# Patient Record
Sex: Male | Born: 1965 | Race: White | Hispanic: No | Marital: Married | Smoking: Never smoker
Health system: Southern US, Community
[De-identification: ages and names within clinical notes are randomized; demographics above are authoritative.]

## PROBLEM LIST (undated history)

## (undated) DIAGNOSIS — E785 Hyperlipidemia, unspecified: Secondary | ICD-10-CM

## (undated) DIAGNOSIS — J309 Allergic rhinitis, unspecified: Secondary | ICD-10-CM

## (undated) DIAGNOSIS — B009 Herpesviral infection, unspecified: Secondary | ICD-10-CM

## (undated) HISTORY — DX: Allergic rhinitis, unspecified: J30.9

## (undated) HISTORY — PX: GANGLION CYST EXCISION: SHX1691

## (undated) HISTORY — DX: Hyperlipidemia, unspecified: E78.5

## (undated) HISTORY — PX: KNEE ARTHROSCOPY: SUR90

## (undated) HISTORY — DX: Herpesviral infection, unspecified: B00.9

---

## 2004-01-11 ENCOUNTER — Ambulatory Visit: Payer: Self-pay | Admitting: Internal Medicine

## 2004-01-21 ENCOUNTER — Ambulatory Visit: Payer: Self-pay | Admitting: Internal Medicine

## 2004-02-26 ENCOUNTER — Ambulatory Visit: Payer: Self-pay | Admitting: Internal Medicine

## 2004-11-13 ENCOUNTER — Ambulatory Visit: Payer: Self-pay | Admitting: Internal Medicine

## 2004-12-10 ENCOUNTER — Ambulatory Visit: Payer: Self-pay | Admitting: Internal Medicine

## 2005-04-20 ENCOUNTER — Ambulatory Visit: Payer: Self-pay | Admitting: Internal Medicine

## 2006-01-11 ENCOUNTER — Ambulatory Visit: Payer: Self-pay | Admitting: Internal Medicine

## 2006-04-09 ENCOUNTER — Ambulatory Visit: Payer: Self-pay | Admitting: Internal Medicine

## 2006-04-09 LAB — CONVERTED CEMR LAB
Cholesterol: 167 mg/dL (ref 0–200)
HDL: 47.4 mg/dL (ref >39.0)
Triglycerides: 110 mg/dL (ref 0–149)
VLDL: 22 mg/dL (ref 0–40)

## 2007-03-10 ENCOUNTER — Encounter: Payer: Self-pay | Admitting: Internal Medicine

## 2007-03-10 DIAGNOSIS — J309 Allergic rhinitis, unspecified: Secondary | ICD-10-CM | POA: Insufficient documentation

## 2007-03-10 DIAGNOSIS — B009 Herpesviral infection, unspecified: Secondary | ICD-10-CM | POA: Insufficient documentation

## 2007-03-10 DIAGNOSIS — Z9889 Other specified postprocedural states: Secondary | ICD-10-CM

## 2007-03-11 ENCOUNTER — Ambulatory Visit: Payer: Self-pay | Admitting: Internal Medicine

## 2007-03-12 DIAGNOSIS — E785 Hyperlipidemia, unspecified: Secondary | ICD-10-CM

## 2007-04-26 ENCOUNTER — Ambulatory Visit: Payer: Self-pay | Admitting: Internal Medicine

## 2007-04-26 LAB — CONVERTED CEMR LAB
AST: 29 units/L (ref 0–37)
Albumin: 4.2 g/dL (ref 3.5–5.2)
Bilirubin, Direct: 0.2 mg/dL (ref 0.0–0.3)
Chloride: 105 meq/L (ref 96–112)
Cholesterol: 137 mg/dL (ref 0–200)
GFR calc Af Amer: 120 mL/min
GFR calc non Af Amer: 99 mL/min
Glucose, Bld: 111 mg/dL — ABNORMAL HIGH (ref 70–99)
HDL: 43.2 mg/dL (ref >39.0)
LDL Cholesterol: 81 mg/dL (ref 0–99)
Potassium: 3.7 meq/L (ref 3.5–5.1)
Sodium: 141 meq/L (ref 135–145)
Total CHOL/HDL Ratio: 3.2
Triglycerides: 62 mg/dL (ref 0–149)
VLDL: 12 mg/dL (ref 0–40)

## 2007-04-27 ENCOUNTER — Encounter: Payer: Self-pay | Admitting: Internal Medicine

## 2007-10-03 ENCOUNTER — Ambulatory Visit: Payer: Self-pay | Admitting: Internal Medicine

## 2009-03-04 ENCOUNTER — Ambulatory Visit: Payer: Self-pay | Admitting: Internal Medicine

## 2009-03-04 DIAGNOSIS — J069 Acute upper respiratory infection, unspecified: Secondary | ICD-10-CM | POA: Insufficient documentation

## 2009-03-04 LAB — CONVERTED CEMR LAB
ALT: 39 units/L (ref 0–53)
Albumin: 4.4 g/dL (ref 3.5–5.2)
BUN: 9 mg/dL (ref 6–23)
Bilirubin, Direct: 0.2 mg/dL (ref 0.0–0.3)
Chloride: 99 meq/L (ref 96–112)
Cholesterol: 198 mg/dL (ref 0–200)
GFR calc non Af Amer: 111.78 mL/min (ref >60)
HDL: 49 mg/dL (ref >39.00)
LDL Cholesterol: 110 mg/dL — ABNORMAL HIGH (ref 0–99)
Potassium: 4.1 meq/L (ref 3.5–5.1)
Total Protein: 7.4 g/dL (ref 6.0–8.3)
Triglycerides: 193 mg/dL — ABNORMAL HIGH (ref 0.0–149.0)
VLDL: 38.6 mg/dL (ref 0.0–40.0)

## 2009-03-05 ENCOUNTER — Encounter: Payer: Self-pay | Admitting: Internal Medicine

## 2009-03-22 ENCOUNTER — Encounter: Payer: Self-pay | Admitting: Internal Medicine

## 2009-03-26 ENCOUNTER — Encounter: Payer: Self-pay | Admitting: Internal Medicine

## 2009-06-21 ENCOUNTER — Ambulatory Visit: Payer: Self-pay | Admitting: Internal Medicine

## 2009-07-04 ENCOUNTER — Ambulatory Visit: Payer: Self-pay | Admitting: Internal Medicine

## 2010-03-11 NOTE — Miscellaneous (Signed)
Summary: Flu Vaccine Given-[facility]   Clinical Lists Changes  Observations: Added new observation of FLU VAX: Historical (03/22/2009 14:56)      Immunization History:  Influenza Immunization History:    Influenza:  historical (03/22/2009)

## 2010-03-11 NOTE — Miscellaneous (Signed)
Summary: FL[facility]IRIN/[facility]  [facility]   Imported By: [place] 03/30/2009 09:04:47  _____________________________________________________________________  External Attachment:    Type:   Image     Comment:   External Document

## 2010-03-11 NOTE — Assessment & Plan Note (Signed)
Summary: sore throat/ear ache/cd   Vital Signs:  Patient profile:   45 year old male Height:      73 inches Weight:      214 pounds BMI:     28.34 O2 Sat:      97 % on Room air Temp:     97.4 degrees F oral Pulse rate:   66 / minute BP sitting:   114 / 78  (left arm) Cuff size:   regular  Vitals Entered By: Bill Salinas CMA (Jul 04, 2009 9:07 AM)  O2 Flow:  Room air CC: pt here with c/o sore throat with drainage x 3 days/ ab   Primary Care Provider:  Ancil Dewan  CC:  pt here with c/o sore throat with drainage x 3 days/ ab.  History of Present Illness: Patient presents with a 3 days h/o sore throat, along with myalgias and odynophagia.Marland Kitchen HIs children have been sick. His wife has had a sore throat and saw Dr. Felicity Coyer earlier in the week. He denies rigors, SOB, cough, N/V/D. He has taken no OTC meds.  He is still in a Doctor, hospital course. He does report that he is sleeping better on medication.   Current Medications (verified): 1)  Allegra-D 12 Hour 60-120 Mg  Tb12 (Fexofenadine-Pseudoephedrine) .... As Needed 2)  Rhinocort Aqua 32 Mcg/act  Susp (Budesonide) .... As Needed 3)  Simvastatin 80 Mg Tabs (Simvastatin) .Marland Kitchen.. 1 By Mouth Qpm 4)  Valacyclovir Hcl 1 Gm Tabs (Valacyclovir Hcl) .Marland Kitchen.. 1 By Mouth Once Daily For Hsv Recurrence 5)  Alprazolam 0.5 Mg Tabs (Alprazolam) .... 1/2 or 1 Q 6 As Needed; 1 At Bedtime As Needed Sleep.  Allergies (verified): No Known Drug Allergies PMH-FH-SH reviewed-no changes except otherwise noted  Review of Systems  The patient denies anorexia, fever, weight loss, hoarseness, chest pain, dyspnea on exertion, abdominal pain, and muscle weakness.    Physical Exam  General:  WNWD white male in no distress Head:  Normocephalic and atraumatic without obvious abnormalities. No apparent alopecia or balding. Eyes:  corneas and lenses clear and no injection.   Mouth:  Posterior pharynx with erythema. NO exudate. Tonsils appear normal.  Neck:   supple, no masses, and no thyromegaly.   Lungs:  normal respiratory effort and normal breath sounds.   Heart:  normal rate and regular rhythm.   Abdomen:  soft, non-tender, and normal bowel sounds.   Pulses:  2+ radial Neurologic:  alert & oriented X3, cranial nerves II-XII intact, and strength normal in all extremities.   Skin:  turgor normal and color normal.   Cervical Nodes:  no anterior cervical adenopathy and no posterior cervical adenopathy.   Psych:  Oriented X3, good eye contact, and not anxious appearing.     Impression & Recommendations:  Problem # 1:  PHARYNGITIS-ACUTE (ICD-462)  Exposure and symptoms. Exam is suspicious  Plan - Amoxicillin 875mg  two times a day x 7           gargle of choice.  His updated medication list for this problem includes:    Amoxicillin 875 Mg Tabs (Amoxicillin) .Marland Kitchen... 1 by mouth two times a day x 7  Complete Medication List: 1)  Allegra-d 12 Hour 60-120 Mg Tb12 (Fexofenadine-pseudoephedrine) .... As needed 2)  Rhinocort Aqua 32 Mcg/act Susp (Budesonide) .... As needed 3)  Simvastatin 80 Mg Tabs (Simvastatin) .Marland Kitchen.. 1 by mouth qpm 4)  Valacyclovir Hcl 1 Gm Tabs (Valacyclovir hcl) .Marland Kitchen.. 1 by mouth once daily for hsv recurrence 5)  Alprazolam 0.5 Mg Tabs (Alprazolam) .... 1/2 or 1 q 6 as needed; 1 at bedtime as needed sleep. 6)  Amoxicillin 875 Mg Tabs (Amoxicillin) .Marland Kitchen.. 1 by mouth two times a day x 7 Prescriptions: AMOXICILLIN 875 MG TABS (AMOXICILLIN) 1 by mouth two times a day x 7  #14 x 0   Entered and Authorized by:   Jacques Navy MD   Signed by:   Jacques Navy MD on 07/04/2009   Method used:   Handwritten   RxID:   [id number]

## 2010-03-11 NOTE — Assessment & Plan Note (Signed)
Summary: rx consult-lb   Vital Signs:  Patient profile:   45 year old male Height:      73 inches Weight:      215 pounds BMI:     28.47 O2 Sat:      97 % on Room air Temp:     98.1 degrees F oral Pulse rate:   64 / minute BP sitting:   118 / 76  (left arm) Cuff size:   regular  Vitals Entered By: Bill Salinas CMA (Jun 21, 2009 9:06 AM)  O2 Flow:  Room air CC: pt here to discuss medications/ ab   Primary Care Provider:  Aleeyah Bensen  CC:  pt here to discuss medications/ ab.  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 He denies any depression, lack of focus or difficutly staying on task   He is also in need of refill Rxs  Current Medications (verified): 1)  Allegra-D 12 Hour 60-120 Mg  Tb12 (Fexofenadine-Pseudoephedrine) .... As Needed 2)  Rhinocort Aqua 32 Mcg/act  Susp (Budesonide) .... As Needed 3)  Simvastatin 80 Mg Tabs (Simvastatin) .Marland Kitchen.. 1 By Mouth Qpm 4)  Valtrex 500 Mg Tabs (Valacyclovir Hcl) .... 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 physically active with tennis, mountain biking  Risk Factors: Smoking Status: never (03/10/2007)  Review of Systems  The patient denies fever, weight loss, weight gain, chest pain, dyspnea on exertion, headaches, abdominal pain, severe  indigestion/heartburn, suspicious skin lesions, and depression.    Physical Exam  General:  Well-developed,well-nourished,in no acute distress; alert,appropriate and cooperative throughout examination Head:  normocephalic and atraumatic.   Eyes:  pupils equal and pupils round.   Neck:  supple.   Lungs:  normal respiratory effort and normal breath sounds.   Heart:  normal rate and regular rhythm.   Neurologic:  alert & oriented X3, cranial nerves II-XII intact, and gait normal.   Skin:  turgor normal and color normal.   Psych:  Oriented X3, memory intact for recent and remote, normally interactive, and good eye contact.     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  Plan - renewed Rx for valtrex (generic)  Problem # 2:  ANXIETY STATE, UNSPECIFIED (ICD-300.00) Peformance anxiety related to stress associated with his training program.  Plan - alprazolam 0.5 mg 1/2 or 1 by mouth q 6 as needed.  His updated medication list for this problem includes:    Alprazolam 0.5 Mg Tabs (Alprazolam) .Marland Kitchen... 1/2 or 1 q 6 as needed; 1 at bedtime as needed sleep.  Complete Medication List: 1)  Allegra-d 12 Hour 60-120 Mg Tb12 (Fexofenadine-pseudoephedrine) .... As needed 2)  Rhinocort Aqua 32 Mcg/act Susp (Budesonide) .... As needed 3)  Simvastatin 80 Mg Tabs (Simvastatin) .Marland Kitchen.. 1 by mouth qpm  4)  Valacyclovir Hcl 1 Gm Tabs (Valacyclovir hcl) .Marland Kitchen.. 1 by mouth once daily for hsv recurrence 5)  Alprazolam 0.5 Mg Tabs (Alprazolam) .... 1/2 or 1 q 6 as needed; 1 at bedtime as needed sleep. Prescriptions: ALPRAZOLAM 0.5 MG TABS (ALPRAZOLAM) 1/2 or 1 q 6 as needed; 1 at bedtime as needed sleep.  #100 x 1   Entered and Authorized by:   Jacques Navy MD   Signed by:   Jacques Navy MD on 06/23/2009   Method used:   Handwritten   RxID:    [id number] VALACYCLOVIR HCL 1 GM TABS (VALACYCLOVIR HCL) 1 by mouth once daily for HSV recurrence  #10 x 5   Entered and Authorized by:   Jacques Navy MD   Signed by:   Jacques Navy MD on 06/21/2009   Method used:   [facility] [address] [phone]* (retail)       [address]       [place], [place]  [address]       Ph: [phone] or [phone]       Fax: [phone]   RxID:   [id number]

## 2010-03-11 NOTE — Assessment & Plan Note (Signed)
Summary: cold,sinus/cd   Vital Signs:  Patient profile:   45 year old male Height:      73 inches Weight:      226 pounds BMI:     29.92 O2 Sat:      97 % on Room air Temp:     98.0 degrees F oral Pulse rate:   90 / minute BP sitting:   120 / 84  (left arm) Cuff size:   regular  Vitals Entered By: Bill Salinas CMA (March 04, 2009 9:33 AM)  O2 Flow:  Room air CC: pt here with complaint of sneezing with green mucous and coughing. Pt also has sinus congestion symptoms present x 3 days with no fever. Pt needs refill on simvastatin/ ab   Primary Care Provider:  Norins  CC:  pt here with complaint of sneezing with green mucous and coughing. Pt also has sinus congestion symptoms present x 3 days with no fever. Pt needs refill on simvastatin/ ab.  History of Present Illness: Presents for routine medical follow up for sneezing, drainage and some green drainage. He has otherwise been doing well.  He is due for lipid follow-up with lab and renewal of Rx.   Current Medications (verified): 1)  Allegra-D 12 Hour 60-120 Mg  Tb12 (Fexofenadine-Pseudoephedrine) .... As Needed 2)  Rhinocort Aqua 32 Mcg/act  Susp (Budesonide) .... As Needed 3)  Simvastatin 40 Mg  Tabs (Simvastatin) .Marland Kitchen.. 1 By Mouth Two Times A Day 4)  Valtrex 500 Mg Tabs (Valacyclovir Hcl) .... 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 physically active with tennis,  mountain biking  Review of Systems  The patient denies anorexia, fever, decreased hearing, chest pain, dyspnea on exertion, peripheral edema, abdominal pain, hematochezia, incontinence, muscle weakness, difficulty walking, unusual weight change, enlarged lymph nodes, and angioedema.    Physical Exam  General:  alert, well-developed, well-nourished, and well-hydrated.   Head:  normocephalic and atraumatic.  nosinus tenderness to exam  Ears:  R ear normal and L ear normal.   Mouth:  Throat clear Neck:  full ROM and no thyromegaly.   Lungs:  normal respiratory effort, normal breath sounds, no crackles, and no wheezes.   Heart:  normal rate and regular rhythm.   Msk:  normal ROM and no joint tenderness.   Neurologic:  alert & oriented X3, cranial nerves II-XII intact, and gait normal.     Impression & Recommendations:  Problem # 1:  URI (ICD-465.9) Exposure to youngsters with illness  Plan - z-pak  His updated medication list for this problem includes:    Allegra-d 12 Hour 60-120 Mg Tb12 (Fexofenadine-pseudoephedrine) .Marland Kitchen... As needed  Problem # 2:  HYPERLIPIDEMIA (ICD-272.4)  due for follow-up lab.  His updated medication list for this problem includes:    Simvastatin 80 Mg Tabs (Simvastatin) .Marland Kitchen... 1 by mouth qpm  Orders: TLB-Lipid Panel (80061-LIPID) TLB-Hepatic/Liver Function Pnl (80076-HEPATIC)  Complete Medication List: 1)  Allegra-d 12 Hour 60-120 Mg Tb12 (Fexofenadine-pseudoephedrine) .... As needed 2)  Rhinocort Aqua  32 Mcg/act Susp (Budesonide) .... As needed 3)  Simvastatin 80 Mg Tabs (Simvastatin) .Marland Kitchen.. 1 by mouth qpm 4)  Valtrex 500 Mg Tabs (Valacyclovir hcl) .... Take 1 tablet by mouth two times a day as needed 5)  Azithromycin 500 Mg Tabs (Azithromycin) .Marland Kitchen.. 1 by mouth once daily x 3  Other Orders: TLB-BMP (Basic Metabolic Panel-BMET) (80048-METABOL) Prescriptions: SIMVASTATIN 80 MG TABS (SIMVASTATIN) 1 by mouth qPM  #30 x 12   Entered and Authorized by:    Jacques Navy MD   Signed by:   Jacques Navy MD on 03/04/2009   Method used:   [facility]        [facility] [address] [phone]* (retail)       [address]       [place], [place]  [address]       Ph: [phone] or [phone]       Fax: [phone]   RxID:   [id number] AZITHROMYCIN 500 MG TABS (AZITHROMYCIN) 1 by mouth once daily x 3  #3 x 0   Entered and Authorized by:   Jacques Navy MD   Signed by:   Jacques Navy MD on 03/04/2009   Method used:   [facility]ron[facility] to        [facility] [address] [phone]* (retail)       [address]       [place], [place]  [address]       Ph: [phone] or [phone]       Fax: [phone]   RxID:   [id number]

## 2010-03-11 NOTE — Letter (Signed)
   [facility] [address] [place], [place]  [address] Phone: [phone]      March 05, 2009   NATANAEL SALADIN [address] [place], [place] [address]  RE:  LAB RESULTS  Dear  Mr. NOLASCO,  The following is an interpretation of your most recent lab tests.  Please take note of any instructions provided or changes to medications that have resulted from your lab work.  ELECTROLYTES:  Good - no changes needed  KIDNEY FUNCTION TESTS:  Good - no changes needed  LIVER FUNCTION TESTS:  Good - no changes needed  Health professionals look at cholesterol as more involved than just the total cholesterol. We consider the level of LDL (bad) cholesterol, HDL (good), cholesterol, and Triglycerides (Grease) in the blood.  1. Your LDL should be under 100, and the HDL should be over 45, if you have any vascular disease such as heart attack, angina, stroke, TIA (mini stroke), claudication (pain in the legs when you walk due to poor circulation),  Abdominal Aortic Aneurysm (AAA), diabetes or prediabetes.  2. Your LDL should be under 130 if you have any two of the following:     a. Smoke or chew tobacco,     b. High blood pressure (if you are on medication or over 140/90 without medication),     c. Male gender,    d. HDL below 40,    e. A male relative (father, brother, or son), who have had any vascular event          as described in #1. above under the age of 57, or a male relative (mother,       sister, or daughter) who had an event as described above under age 1. (An HDL over 60 will subtract one risk factor from the total, so if you have two items in # 2 above, but an HDL over 60, you then fall into category # 3 below).  3. Your LDL should be under 160 if you have any one of the above.  Triglycerides should be under 200 with the ideal being under 150.  For diabetes or pre-diabetes, the ideal HgbA1C should be under 6.0%.  If you fall into any of the above categories, you  should make a follow up appointment to discuss this with your physician.  LIPID PANEL:  Good - no changes needed Triglyceride: 193.0   Cholesterol: 198   LDL: 110   HDL: 49.00   Chol/HDL%:  4   Adequate cholesterol control on present medications.  Call or e-mail me if you have questions ([email or web address]).   Sincerely Yours,    Jacques Navy MD

## 2010-04-29 ENCOUNTER — Other Ambulatory Visit: Payer: Self-pay | Admitting: *Deleted

## 2010-05-07 ENCOUNTER — Telehealth: Payer: Self-pay | Admitting: *Deleted

## 2010-05-07 MED ORDER — SIMVASTATIN 80 MG PO TABS: 80.0000 mg | ORAL_TABLET | Freq: Every day | ORAL | Status: DC

## 2010-05-07 NOTE — Telephone Encounter (Signed)
refills  

## 2010-09-12 ENCOUNTER — Encounter: Payer: Self-pay | Admitting: Internal Medicine

## 2010-09-12 ENCOUNTER — Ambulatory Visit (INDEPENDENT_AMBULATORY_CARE_PROVIDER_SITE_OTHER): Payer: 59 | Admitting: Internal Medicine

## 2010-09-12 DIAGNOSIS — B009 Herpesviral infection, unspecified: Secondary | ICD-10-CM

## 2010-09-12 DIAGNOSIS — E785 Hyperlipidemia, unspecified: Secondary | ICD-10-CM

## 2010-09-12 DIAGNOSIS — Z0000 Encounter for general adult medical examination without abnormal findings: Secondary | ICD-10-CM

## 2010-09-12 MED ORDER — VALACYCLOVIR HCL 1 G PO TABS: 1000.0000 mg | ORAL_TABLET | Freq: Every day | ORAL | Status: DC

## 2010-09-12 MED ORDER — SIMVASTATIN 80 MG PO TABS: 80.0000 mg | ORAL_TABLET | Freq: Every day | ORAL | Status: DC

## 2010-09-12 MED ORDER — ALPRAZOLAM 0.5 MG PO TABS: 0.5000 mg | ORAL_TABLET | Freq: Every evening | ORAL | Status: DC | PRN

## 2010-09-12 MED ORDER — BUDESONIDE 32 MCG/ACT NA SUSP: 1.0000 | Freq: Every day | NASAL | Status: AC

## 2010-09-12 NOTE — Progress Notes (Signed)
Subjective:    Patient ID: Jack Farley, male    DOB: 05-Feb-1966, 45 y.o.   MRN: [id number]  HPI Jack Farley presents to follow-up. In the interval since last visit he has had knife less vasectomy. He is commuting to [place] 3 days a week - 12 hr days. He is trying to get in some exercise. He feels he is in good health and doing well.   Past Medical History  Diagnosis Date  . Herpes simplex without mention of complication   . Allergic rhinitis, cause unspecified    Past Surgical History  Procedure Date  . Knee arthroscopy    Family History  Problem Relation Age of Onset  . Mental illness Mother   . Hyperlipidemia Father   . Cancer Neg Hx   . Diabetes Neg Hx   . Heart disease Neg Hx    History   Social History  . Marital Status: Married    Spouse Name: N/A    Number of Children: 2  . Years of Education: 18   Occupational History  . Firefighter and investments    Social History Main Topics  . Smoking status: Never Smoker   . Smokeless tobacco: Never Used  . Alcohol Use: Yes  . Drug Use: No  . Sexually Active: Yes -- Male partner(s)   Other Topics Concern  . Not on file   Social History Narrative   [place], MBA [employer], Married '04. 2 sons- '06, '09. 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 Spouse- Psychologist, educational and full-time mother. Physically active when time allows  with mountain biking and tennis. Life is good.       Review of Systems Review of Systems  Constitutional:  Negative for fever, chills, activity change and unexpected weight change.  HEENT:  Negative for hearing loss, ear pain, congestion, neck stiffness and postnasal drip. Negative for sore throat or swallowing problems. Negative for dental complaints.   Eyes: Negative for vision loss or change in visual acuity.  Respiratory: Negative for chest tightness and wheezing.   Cardiovascular: Negative for chest pain and palpitation. No decreased exercise  tolerance Gastrointestinal: No change in bowel habit. No bloating or gas. No reflux or indigestion Genitourinary: Negative for urgency, frequency, flank pain and difficulty urinating.  Musculoskeletal: Negative for myalgias, back pain, arthralgias and gait problem.  Neurological: Negative for dizziness, tremors, weakness and headaches.  Hematological: Negative for adenopathy.  Psychiatric/Behavioral: Negative for behavioral problems and dysphoric mood.       Objective:   Physical Exam Vital signs reviewed Gen'l: Well nourished well developed white male in no acute distress  HENT:  Head: Normocephalic and atraumatic.  Right Ear: External ear normal. EAC/TM nl Left Ear: External ear normal.  EAC/TM nl Nose: Nose normal.  Mouth/Throat: Oropharynx is clear and moist. Dentition - native, in good repair. No buccal or palatal lesions. Posterior pharynx clear. Eyes: Conjunctivae and sclera clear. EOM intact. Pupils are equal, round, and reactive to light. Right eye exhibits no discharge. Left eye exhibits no discharge. Neck: Normal range of motion. Neck supple. No JVD present. No tracheal deviation present. No thyromegaly present.  Cardiovascular: Normal rate, regular rhythm, no gallop, no friction rub, no murmur heard.      Quiet precordium. 2+ radial and DP pulses . No carotid bruits Pulmonary/Chest: Effort normal. No respiratory distress or increased WOB, no wheezes, no rales. No chest wall deformity or CVAT. Abdominal: Soft. Bowel sounds are normal in all quadrants. He  exhibits no distension, no tenderness, no rebound or guarding, No heptosplenomegaly  Genitourinary:  deferred Musculoskeletal: Normal range of motion. He exhibits no edema and no tenderness.       Small and large joints without redness, synovial thickening or deformity. Full range of motion preserved about all small, median and large joints.  Lymphadenopathy:    He has no cervical or supraclavicular adenopathy.    Neurological: He is alert and oriented to person, place, and time. CN II-XII intact. DTRs 2+ and symmetrical biceps, radial and patellar tendons. Cerebellar function normal with no tremor, rigidity, normal gait and station.  Skin: Skin is warm and dry. No rash noted. No erythema.  Psychiatric: He has a normal mood and affect. His behavior is normal. Thought content normal.   Lab Results  Component Value Date   CHOL 198 03/04/2009   TRIG 193.0* 03/04/2009   HDL 49.00 03/04/2009   ALT 39 03/04/2009   AST 28 03/04/2009   NA 138 03/04/2009   K 4.1 03/04/2009   CL 99 03/04/2009   CREATININE 0.8 03/04/2009   BUN 9 03/04/2009   CO2 31 03/04/2009   Lab Results  Component Value Date   LDLCALC 110* 03/04/2009           Assessment & Plan:

## 2010-09-14 ENCOUNTER — Encounter: Payer: Self-pay | Admitting: Internal Medicine

## 2010-09-14 DIAGNOSIS — Z0000 Encounter for general adult medical examination without abnormal findings: Secondary | ICD-10-CM | POA: Insufficient documentation

## 2010-09-14 NOTE — Assessment & Plan Note (Signed)
No labs since Jan '11. He reports that he has been off medication for several weeks due to Rx running out. He was on Zocor 80 mg and has been on this dose for more than 1 year without side effects or complications. Per FDA advisory he may remain on the same medication.  Plan - return Sept 3 for labs with recommendations to follow

## 2010-09-14 NOTE — Assessment & Plan Note (Signed)
Interval history notable for vasectomy. Physical exam is normal. Labs are pending - Sept 3rd. Due for tetanus booster.  In summary- a very nice man who is busy with work and a young family. He is encouraged to find the time for exercise as a worthwhile investment in his health. He should return for a general exam in 2 years.

## 2010-09-14 NOTE — Assessment & Plan Note (Signed)
Stable. He has 3 outbreaks a year max and does not need daily suppression.  Plan - refilled Rx.

## 2010-09-15 ENCOUNTER — Telehealth: Payer: Self-pay | Admitting: *Deleted

## 2010-09-15 NOTE — Telephone Encounter (Signed)
error 

## 2010-10-16 ENCOUNTER — Other Ambulatory Visit (INDEPENDENT_AMBULATORY_CARE_PROVIDER_SITE_OTHER): Payer: 59

## 2010-10-16 DIAGNOSIS — E785 Hyperlipidemia, unspecified: Secondary | ICD-10-CM

## 2010-10-16 DIAGNOSIS — Z0000 Encounter for general adult medical examination without abnormal findings: Secondary | ICD-10-CM

## 2010-10-16 LAB — HEPATIC FUNCTION PANEL
ALT: 39 U/L (ref 0–53)
AST: 32 U/L (ref 0–37)
Total Bilirubin: 2 mg/dL — ABNORMAL HIGH (ref 0.3–1.2)
Total Protein: 7.6 g/dL (ref 6.0–8.3)

## 2010-10-16 LAB — COMPREHENSIVE METABOLIC PANEL
AST: 32 U/L (ref 0–37)
Albumin: 4.6 g/dL (ref 3.5–5.2)
Alkaline Phosphatase: 40 U/L (ref 39–117)
Glucose, Bld: 104 mg/dL — ABNORMAL HIGH (ref 70–99)
Potassium: 3.6 mEq/L (ref 3.5–5.1)
Sodium: 141 mEq/L (ref 135–145)
Total Bilirubin: 2 mg/dL — ABNORMAL HIGH (ref 0.3–1.2)
Total Protein: 7.6 g/dL (ref 6.0–8.3)

## 2010-10-16 LAB — LIPID PANEL
Cholesterol: 146 mg/dL (ref 0–200)
HDL: 56.4 mg/dL (ref >39.00)
Triglycerides: 87 mg/dL (ref 0.0–149.0)
VLDL: 17.4 mg/dL (ref 0.0–40.0)

## 2010-10-20 ENCOUNTER — Encounter: Payer: Self-pay | Admitting: Internal Medicine

## 2011-07-24 ENCOUNTER — Other Ambulatory Visit: Payer: Self-pay | Admitting: *Deleted

## 2011-07-24 MED ORDER — SIMVASTATIN 80 MG PO TABS: 80.0000 mg | ORAL_TABLET | Freq: Every day | ORAL | Status: DC

## 2011-07-24 NOTE — Telephone Encounter (Signed)
REFILL SENT TO [facility] FOR ZOCAR AND MESSAGE TO MAKE APPT.FOR YEARLY EXAM

## 2011-08-04 ENCOUNTER — Other Ambulatory Visit: Payer: Self-pay

## 2011-08-04 MED ORDER — ALPRAZOLAM 0.5 MG PO TABS: 0.5000 mg | ORAL_TABLET | Freq: Every evening | ORAL | Status: DC | PRN

## 2011-08-04 NOTE — Telephone Encounter (Signed)
Refill on medication alprazolam called to [facility] pharmacy. Patient notified

## 2011-08-04 NOTE — Telephone Encounter (Signed)
The flaw with e-mail is reliable transfer of information. My fault. Better for refill requests to come through regular channels.   OK for refill for alprazolam x 5

## 2011-08-04 NOTE — Telephone Encounter (Signed)
Pt called stating he has been emailing with MD back and forth regarding refill of alprazolam but so far Rx is not at [facility]. Pt is requesting status of this refill, please advise.

## 2012-08-08 ENCOUNTER — Ambulatory Visit (INDEPENDENT_AMBULATORY_CARE_PROVIDER_SITE_OTHER): Payer: BC Managed Care – PPO | Admitting: Internal Medicine

## 2012-08-08 ENCOUNTER — Encounter: Payer: Self-pay | Admitting: Internal Medicine

## 2012-08-08 VITALS — BP 120/82 | HR 79 | Temp 97.8°F | Ht 73.0 in | Wt 226.8 lb

## 2012-08-08 DIAGNOSIS — E785 Hyperlipidemia, unspecified: Secondary | ICD-10-CM

## 2012-08-08 DIAGNOSIS — B009 Herpesviral infection, unspecified: Secondary | ICD-10-CM

## 2012-08-08 DIAGNOSIS — Z23 Encounter for immunization: Secondary | ICD-10-CM

## 2012-08-08 DIAGNOSIS — Z0000 Encounter for general adult medical examination without abnormal findings: Secondary | ICD-10-CM

## 2012-08-08 MED ORDER — SIMVASTATIN 80 MG PO TABS: 80.0000 mg | ORAL_TABLET | Freq: Every day | ORAL | Status: DC

## 2012-08-08 MED ORDER — VALACYCLOVIR HCL 1 G PO TABS: 1000.0000 mg | ORAL_TABLET | Freq: Every day | ORAL | Status: DC

## 2012-08-08 MED ORDER — ALPRAZOLAM 0.5 MG PO TABS: 0.5000 mg | ORAL_TABLET | Freq: Every evening | ORAL | Status: AC | PRN

## 2012-08-08 MED ORDER — SIMVASTATIN 80 MG PO TABS: 80.0000 mg | ORAL_TABLET | Freq: Every day | ORAL | Status: AC

## 2012-08-08 MED ORDER — VALACYCLOVIR HCL 1 G PO TABS: 1000.0000 mg | ORAL_TABLET | Freq: Every day | ORAL | Status: AC

## 2012-08-08 NOTE — Progress Notes (Signed)
[facility] would not [facility] electronically.

## 2012-08-08 NOTE — Patient Instructions (Addendum)
Thanks for coming in.  For cholesterol - restart simvastatin and have lab work 4 weeks later. Lab orders are on fill: no appointment is necessary and results will be available on MyChart.  Exercise and fitness are THE most important health investment you can make. Minimal requirement is 3 sessions a week where you sustain a heart rate about 180% of resting for 30 minutes (need warm up and cool down). You can do any exercise that will get you to goal. You also need to work on Flex and stretch.  For entertainment and education read: Younger Next Year - for men. If you do not find this fun and helpful I will pay you back the purchase price.  Immunizations today - Tdap, good for ten years.  Screening up to date for now. At 50 we look to colorectal cancer screening. Prostate screening is controversial: [place] preventive healthcare taskforece says to not screen. The [employer] of Urology say to screen from 50 - 59. It is all about comfort with risk, the frequency of the disease and how slowly it moves. Can discuss more at 50.   For rectal discomfort - Nupercainal ointment can be very helpful - OTC and comes in a generic as well.  Fecal staining - common and not an issue unless it gets progressively worse.  As an aside - there is a theory about the sanctity of the parental bed!!

## 2012-08-08 NOTE — Progress Notes (Signed)
Subjective:    Patient ID: Jack Farley, male    DOB: [birth year], 47 y.o.   MRN: [id number]  HPI Mr. Slinker presents for routine medical follow up. He is doing well: has been healthy with no major illness, no surgery no injury. He is working out some. Current with dentist and eye doctor - wears contacts.   Past Medical History  Diagnosis Date  . Herpes simplex without mention of complication   . Allergic rhinitis, cause unspecified    Past Surgical History  Procedure Laterality Date  . Knee arthroscopy     Family History  Problem Relation Age of Onset  . Mental illness Mother   . Hyperlipidemia Father   . Cancer Neg Hx   . Diabetes Neg Hx   . Heart disease Neg Hx    History   Social History  . Marital Status: Married    Spouse Name: N/A    Number of Children: 2  . Years of Education: 18   Occupational History  . Firefighter and investments    Social History Main Topics  . Smoking status: Never Smoker   . Smokeless tobacco: Never Used  . Alcohol Use: Yes  . Drug Use: No  . Sexually Active: Yes -- Male partner(s)   Other Topics Concern  . Not on file   Social History Narrative   [place], MBA [employer], Married '04. 2 sons- '06, '09. 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 Spouse- Psychologist, educational and full-time mother. Physically active when time allows  with mountain biking and tennis. Life is good.    Current Outpatient Prescriptions on File Prior to Visit  Medication Sig Dispense Refill  . ALPRAZolam (XANAX) 0.5 MG tablet Take 1 tablet (0.5 mg total) by mouth at bedtime as needed. 1/2 or 1 tablet q 6 hours prn , 1 at bedtime prn  60 tablet  5  . budesonide (RHINOCORT AQUA) 32 MCG/ACT nasal spray Place 1 spray into the nose daily.  1 Bottle  2  . fexofenadine-pseudoephedrine (ALLEGRA-D) 60-120 MG per tablet Take 1 tablet by mouth 2 (two) times daily.        . valACYclovir (VALTREX) 1000 MG tablet Take 1 tablet (1,000 mg  total) by mouth daily.  15 tablet  2  . simvastatin (ZOCOR) 80 MG tablet Take 1 tablet (80 mg total) by mouth at bedtime. PATIENT NEEDS TO MAKE YEARLY OFFICE VISIT WITH DR. Debby Bud. LOV 09/2010  90 tablet  3   No current facility-administered medications on file prior to visit.          Review of Systems Constitutional:  Negative for fever, chills, activity change and unexpected weight change.  HEENT:  Negative for hearing loss, ear pain, congestion, neck stiffness and postnasal drip. Negative for sore throat or swallowing problems. Negative for dental complaints.   Eyes: Negative for vision loss or change in visual acuity.  Respiratory: Negative for chest tightness and wheezing. Negative for DOE.   Cardiovascular: Negative for chest pain or palpitations. No decreased exercise tolerance Gastrointestinal: No change in bowel habit. No bloating or gas. No reflux or indigestion Genitourinary: Negative for urgency, frequency, flank pain and difficulty urinating.  Musculoskeletal: Negative for myalgias, back pain, arthralgias and gait problem.  Neurological: Negative for dizziness, tremors, weakness and headaches.  Hematological: Negative for adenopathy.  Psychiatric/Behavioral: Negative for behavioral problems and dysphoric mood.     Objective:   Physical Exam Filed Vitals:   08/08/12 1336  BP:  120/82  Pulse: 79  Temp: 97.8 F (36.6 C)   Wt Readings from Last 3 Encounters:  08/08/12 226 lb 12.8 oz (102.876 kg)  09/12/10 223 lb (101.152 kg)  07/04/09 214 lb (97.07 kg)   Gen'l: Well nourished well developed, overweight white male in no acute distress  HEENT: Head: Normocephalic and atraumatic. Right Ear: External ear normal. EAC/TM nl. Left Ear: External ear normal.  EAC/TM nl. Nose: Nose normal. Mouth/Throat: Oropharynx is clear and moist. Dentition - native, in good repair. No buccal or palatal lesions. Posterior pharynx clear. Eyes: Conjunctivae and sclera clear. EOM intact. Pupils  are equal, round, and reactive to light. Right eye exhibits no discharge. Left eye exhibits no discharge. Neck: Normal range of motion. Neck supple. No JVD present. No tracheal deviation present. No thyromegaly present.  Cardiovascular: Normal rate, regular rhythm, no gallop, no friction rub, no murmur heard.      Quiet precordium. 2+ radial and DP pulses . No carotid bruits Pulmonary/Chest: Effort normal. No respiratory distress or increased WOB, no wheezes, no rales. No chest wall deformity or CVAT. Abdomen: Soft. Bowel sounds are normal in all quadrants. He exhibits no distension, no tenderness, no rebound or guarding, No heptosplenomegaly  Genitourinary:  deferred Musculoskeletal: Normal range of motion. He exhibits no edema and no tenderness.       Small and large joints without redness, synovial thickening or deformity. Full range of motion preserved about all small, median and large joints.  Lymphadenopathy:    He has no cervical or supraclavicular adenopathy.  Neurological: He is alert and oriented to person, place, and time. CN II-XII intact. DTRs 2+ and symmetrical biceps, radial and patellar tendons. Cerebellar function normal with no tremor, rigidity, normal gait and station.  Skin: Skin is warm and dry. No rash noted. No erythema.  Psychiatric: He has a normal mood and affect. His behavior is normal. Thought content normal.   Labs pending - restart of simvastatin       Assessment & Plan:

## 2012-08-11 NOTE — Assessment & Plan Note (Signed)
Continues to have periodic outbreaks but has a good response to valtrex.  Refill Rx provided.

## 2012-08-11 NOTE — Assessment & Plan Note (Signed)
He has been off medication for several weeks due to mix up on refill authorization.  Plan Resume medication - high dose simvastatin that was well tolerated  Follow up lab

## 2012-08-11 NOTE — Assessment & Plan Note (Signed)
Interval history is unremarkable w/o major illness, injury or surgery. Physical exam is normal except for weight. Lab is pending - he will restart simvastatin and come in for 4 lab 4 weeks after. Given Tdap today.  In summary - a healthy man followed for hyperlipidemia. He is encouraged to develop a regular exercise program and to work on weight management: Diet management: smart food choices, PORTION SIZE CONTROL, regular exercise. Goal - to loose 1-2 lbs.month. Target weight - 200 to 210. He will be advised as to medication dosing for cholesterol management based on lab results. Next full physical in 2-3 years but he will need annual lipid labs for refills of medication.

## 2012-09-15 ENCOUNTER — Telehealth: Payer: Self-pay

## 2012-09-15 NOTE — Telephone Encounter (Signed)
Patient called back and stated he is unable to get his Simvastatin 80 mg from [facility]. I advised patient I will check into it.  [facility] call to [facility] representative. Since this is patient's first time filling with them she wanted to know was this dose of Simvastatin something patient has already been on or was it new. I let her know this is something he's been on. I gave her the verbal for refill over the phone #[phone] plus 3 refills. She states this will be filled for the patient.

## 2012-09-15 NOTE — Telephone Encounter (Signed)
Patient called [facility] stating that he has been awaiting his Rx for simvastatin from [facility] but has not received anything. He advisd that he called the company who has requesting additional information (didnt mention what information). Are you aware of what he may be speaking of?

## 2012-09-15 NOTE — Telephone Encounter (Signed)
Left message for patient to return call.

## 2012-11-21 ENCOUNTER — Other Ambulatory Visit (INDEPENDENT_AMBULATORY_CARE_PROVIDER_SITE_OTHER): Payer: BC Managed Care – PPO

## 2012-11-21 DIAGNOSIS — E785 Hyperlipidemia, unspecified: Secondary | ICD-10-CM

## 2012-11-21 LAB — HEPATIC FUNCTION PANEL
ALT: 51 U/L (ref 0–53)
AST: 39 U/L — ABNORMAL HIGH (ref 0–37)
Alkaline Phosphatase: 39 U/L (ref 39–117)
Bilirubin, Direct: 0.3 mg/dL (ref 0.0–0.3)
Total Bilirubin: 2 mg/dL — ABNORMAL HIGH (ref 0.3–1.2)

## 2012-11-21 LAB — LIPID PANEL
LDL Cholesterol: 81 mg/dL (ref 0–99)
Total CHOL/HDL Ratio: 3
VLDL: 26.8 mg/dL (ref 0.0–40.0)

## 2012-12-15 ENCOUNTER — Other Ambulatory Visit: Payer: Self-pay

## 2013-01-30 ENCOUNTER — Encounter: Payer: Self-pay | Admitting: Internal Medicine

## 2013-01-30 ENCOUNTER — Ambulatory Visit (INDEPENDENT_AMBULATORY_CARE_PROVIDER_SITE_OTHER): Payer: BC Managed Care – PPO | Admitting: Internal Medicine

## 2013-01-30 VITALS — BP 140/110 | HR 61 | Temp 98.1°F | Wt 225.0 lb

## 2013-01-30 DIAGNOSIS — R198 Other specified symptoms and signs involving the digestive system and abdomen: Secondary | ICD-10-CM

## 2013-01-30 DIAGNOSIS — R194 Change in bowel habit: Secondary | ICD-10-CM

## 2013-01-30 DIAGNOSIS — K921 Melena: Secondary | ICD-10-CM

## 2013-01-30 NOTE — Progress Notes (Signed)
   Subjective:    Patient ID: Jack Farley, male    DOB: 07/31/1965, 47 y.o.   MRN: [id number]  HPI Jack Farley presents for follow up: he has had a change in bowel habit for many weeks involving mild fecal soilage. He has also continued to have intermittent hematochezia with BM - usually on the tissue. His concern is for more serious underlying GI issue(s) with the colon or anal sphincter.  PMH, FamHx and SocHx reviewed for any changes and relevance.  Current Outpatient Prescriptions on File Prior to Visit  Medication Sig Dispense Refill  . ALPRAZolam (XANAX) 0.5 MG tablet Take 1 tablet (0.5 mg total) by mouth at bedtime as needed. 1/2 or 1 tablet q 6 hours prn , 1 at bedtime prn  60 tablet  5  . budesonide (RHINOCORT AQUA) 32 MCG/ACT nasal spray Place 1 spray into the nose daily.  1 Bottle  2  . fexofenadine-pseudoephedrine (ALLEGRA-D) 60-120 MG per tablet Take 1 tablet by mouth 2 (two) times daily.        . simvastatin (ZOCOR) 80 MG tablet Take 1 tablet (80 mg total) by mouth at bedtime.  90 tablet  3  . valACYclovir (VALTREX) 1000 MG tablet Take 1 tablet (1,000 mg total) by mouth daily.  15 tablet  3   No current facility-administered medications on file prior to visit.      Review of Systems System review is negative for any constitutional, cardiac, pulmonary, GI or neuro symptoms or complaints other than as described in the HPI.      Objective:   Physical Exam Filed Vitals:   01/30/13 1043  BP: 140/110  Pulse: 61  Temp: 98.1 F (36.7 C)  Repeat manual BP 118/82 right arm Gen'l - WNWD man in no distress HEENT - C&S clear Cor 2+ radial, RRR Pulm - normal Neuro - A&O x 3.       Assessment & Plan:  Issue of intermittent blood in the stool and "fecal soilage."  Plan Referral to Dr. Rhea Belton, GI, for consultation in regard to sphincter tone issues and full evaluation for hematochezia  If you don't hear about an appointment date by next week - send a Mychart message.

## 2013-01-30 NOTE — Patient Instructions (Signed)
Issue of intermittent blood in the stool and "fecal soilage."  Plan Referral to Dr. Rhea Belton, GI, for consultation in regard to sphincter tone issues and full evaluation for hematochezia  If you don't hear about an appointment date by next week - send a Mychart message.

## 2013-01-30 NOTE — Progress Notes (Signed)
Pre visit review using our clinic review tool, if applicable. No additional management support is needed unless otherwise documented below in the visit note. 

## 2013-02-13 ENCOUNTER — Encounter: Payer: Self-pay | Admitting: Internal Medicine

## 2013-03-03 ENCOUNTER — Encounter: Payer: Self-pay | Admitting: Internal Medicine

## 2013-03-08 ENCOUNTER — Encounter: Payer: Self-pay | Admitting: Internal Medicine

## 2013-03-08 ENCOUNTER — Ambulatory Visit (INDEPENDENT_AMBULATORY_CARE_PROVIDER_SITE_OTHER): Payer: BC Managed Care – PPO | Admitting: Internal Medicine

## 2013-03-08 VITALS — BP 106/70 | HR 64 | Ht 72.5 in | Wt 230.2 lb

## 2013-03-08 DIAGNOSIS — K625 Hemorrhage of anus and rectum: Secondary | ICD-10-CM

## 2013-03-08 DIAGNOSIS — Z1211 Encounter for screening for malignant neoplasm of colon: Secondary | ICD-10-CM

## 2013-03-08 DIAGNOSIS — R151 Fecal smearing: Secondary | ICD-10-CM

## 2013-03-08 MED ORDER — MOVIPREP 100 G PO SOLR: ORAL | Status: DC

## 2013-03-08 NOTE — Patient Instructions (Signed)
You have been scheduled for a colonoscopy with propofol. Please follow written instructions given to you at your visit today.  Please pick up your prep kit at the pharmacy within the next 1-3 days. If you use inhalers (even only as needed), please bring them with you on the day of your procedure. Your physician has requested that you go to www.startemmi.com and enter the access code given to you at your visit today. This web site gives a general overview about your procedure. However, you should still follow specific instructions given to you by our office regarding your preparation for the procedure.  We have sent the following medications to your pharmacy for you to pick up at your convenience: Moviprep, you were given instructions today at your visit.                                               We are excited to introduce MyChart, a new best-in-class service that provides you online access to important information in your electronic medical record. We want to make it easier for you to view your health information - all in one secure location - when and where you need it. We expect MyChart will enhance the quality of care and service we provide.  When you register for MyChart, you can:    View your test results.    Request appointments and receive appointment reminders via email.    Request medication renewals.    View your medical history, allergies, medications and immunizations.    Communicate with your physician's office through a password-protected site.    Conveniently print information such as your medication lists.  To find out if MyChart is right for you, please talk to a member of our clinical staff today. We will gladly answer your questions about this free health and wellness tool.  If you are age 55 or older and want a member of your family to have access to your record, you must provide written consent by completing a proxy form available at our office. Please speak to  our clinical staff about guidelines regarding accounts for patients younger than age 26.  As you activate your MyChart account and need any technical assistance, please call the MyChart technical support line at [phone]) or email your question to [email or web address]. If you email your question(s), please include your name, a return phone number and the best time to reach you.  If you have non-urgent health-related questions, you can send a message to our office through [place] at [email or web address]. If you have a medical emergency, call 911.  Thank you for using MyChart as your new health and wellness resource!   MyChart licensed from [employer],  1999-2010. Patents Pending.

## 2013-03-08 NOTE — Progress Notes (Signed)
Patient ID: Banjamin Stovall, male   DOB: [birth year], 48 y.o.   MRN: [id number] HPI: Aldric Wenzler is a 48 yo male with PMH of HL, allergic rhinitis who is seen in consultation at the request of Dr. Linda Hedges to evaluate scant rectal bleeding and fecal seepage.  He is here alone today.  He notes that over the last 6-7 months he has developed occasional red blood with wiping and also fecal soilage. He notices this in his underwear and does not have perception of leaking stool. He is not having full encopresis. He denies diarrhea or constipation. No abdominal pain. He does occasionally have some perianal itching and he has been using topical hydrocortisone on an as-needed basis. No weight loss. Good appetite. No upper GI symptoms including no nausea or vomiting, significant heartburn, and dysphagia or odynophagia. He is having one formed brown bowel movement each morning. No visible blood on the stool and no melena. The blood that he is seeing is with wiping. No recent change in medications. No family history of colorectal cancer or polyps. No family history of IBD. He has never had endoscopic procedures  Past Medical History  Diagnosis Date  . Herpes simplex without mention of complication   . Allergic rhinitis, cause unspecified   . HLD (hyperlipidemia)     Past Surgical History  Procedure Laterality Date  . Knee arthroscopy Left     age 84  . Ganglion cyst excision Right     foot    Current Outpatient Prescriptions  Medication Sig Dispense Refill  . ALPRAZolam (XANAX) 0.5 MG tablet Take 1 tablet (0.5 mg total) by mouth at bedtime as needed. 1/2 or 1 tablet q 6 hours prn , 1 at bedtime prn  60 tablet  5  . budesonide (RHINOCORT AQUA) 32 MCG/ACT nasal spray Place 1 spray into the nose daily.  1 Bottle  2  . fexofenadine-pseudoephedrine (ALLEGRA-D) 60-120 MG per tablet Take 1 tablet by mouth 2 (two) times daily.        . simvastatin (ZOCOR) 80 MG tablet Take 1 tablet (80 mg total) by mouth at  bedtime.  90 tablet  3  . valACYclovir (VALTREX) 1000 MG tablet Take 1 tablet (1,000 mg total) by mouth daily.  15 tablet  3  . MOVIPREP 100 G SOLR Use per prep instruction  1 kit  0   No current facility-administered medications for this visit.    No Known Allergies  Family History  Problem Relation Age of Onset  . Mental illness Mother   . Hyperlipidemia Father   . Cancer Neg Hx   . Diabetes Neg Hx   . Heart disease Neg Hx   . Heart attack Paternal Grandfather     History  Substance Use Topics  . Smoking status: Never Smoker   . Smokeless tobacco: Never Used  . Alcohol Use: Yes     Comment: 1-2 per day    ROS: As per history of present illness, otherwise negative  BP 106/70  Pulse 64  Ht 6' 0.5" (1.842 m)  Wt 230 lb 4 oz (104.441 kg)  BMI 30.78 kg/m2 Constitutional: Well-developed and well-nourished. No distress. HEENT: Normocephalic and atraumatic. Oropharynx is clear and moist. No oropharyngeal exudate. Conjunctivae are normal.  No scleral icterus. Neck: Neck supple. Trachea midline. Cardiovascular: Normal rate, regular rhythm and intact distal pulses. No M/R/G Pulmonary/chest: Effort normal and breath sounds normal. No wheezing, rales or rhonchi. Abdominal: Soft, nontender, nondistended. Bowel sounds active throughout. There are no  masses palpable. No hepatosplenomegaly. Extremities: no clubbing, cyanosis, or edema Lymphadenopathy: No cervical adenopathy noted. Neurological: Alert and oriented to person place and time. Psychiatric: Normal mood and affect. Behavior is normal.  RELEVANT LABS  CMP     Component Value Date/Time   NA 141 10/16/2010 1335   K 3.6 10/16/2010 1335   CL 102 10/16/2010 1335   CO2 29 10/16/2010 1335   GLUCOSE 104* 10/16/2010 1335   BUN 13 10/16/2010 1335   CREATININE 0.9 10/16/2010 1335   CALCIUM 9.3 10/16/2010 1335   PROT 7.4 11/21/2012 1413   ALBUMIN 4.5 11/21/2012 1413   AST 39 11/21/2012 1413   ALT 51 11/21/2012 1413   ALKPHOS 39  11/21/2012 1413   BILITOT 2.0* 11/21/2012 1413   GFRNONAA 111.78 03/04/2009 0951   GFRAA 120 04/26/2007 0748    ASSESSMENT/PLAN:  48 yo male with PMH of HL, allergic rhinitis who is seen in consultation at the request of Dr. Linda Hedges to evaluate scant rectal bleeding and fecal seepage.  1.  Scant rectal bleeding/fecal soilage/CRC screening -- symptoms have been present over the last 6-7 months and I have recommended colonoscopy for direct visualization. The differential includes internal hemorrhoids with prolapse which has led to the bleeding and fecal soilage, but also I would like to rule out proctitis.  We discussed the procedure today in detail including risks and benefits and he is agreeable to proceed. Showed internal hemorrhoids be found, hydrocortisone suppositories would be an option, but also consideration of banding depending on the size of the hemorrhoids.  Further recommendations after procedure

## 2013-03-09 ENCOUNTER — Encounter: Payer: Self-pay | Admitting: Internal Medicine

## 2013-03-10 ENCOUNTER — Encounter: Payer: Self-pay | Admitting: Internal Medicine

## 2013-03-10 ENCOUNTER — Ambulatory Visit (AMBULATORY_SURGERY_CENTER): Payer: BC Managed Care – PPO | Admitting: Internal Medicine

## 2013-03-10 VITALS — BP 111/60 | HR 63 | Temp 97.7°F | Resp 18 | Ht 73.0 in | Wt 225.0 lb

## 2013-03-10 DIAGNOSIS — R151 Fecal smearing: Secondary | ICD-10-CM

## 2013-03-10 DIAGNOSIS — K625 Hemorrhage of anus and rectum: Secondary | ICD-10-CM

## 2013-03-10 DIAGNOSIS — Z1211 Encounter for screening for malignant neoplasm of colon: Secondary | ICD-10-CM

## 2013-03-10 MED ORDER — SODIUM CHLORIDE 0.9 % IV SOLN: 500.0000 mL | INTRAVENOUS | Status: DC

## 2013-03-10 NOTE — Patient Instructions (Signed)
YOU HAD AN ENDOSCOPIC PROCEDURE TODAY AT THE [facility]: Refer to the procedure report that was given to you for any specific questions about what was found during the examination.  If the procedure report does not answer your questions, please call your gastroenterologist to clarify.  If you requested that your care partner not be given the details of your procedure findings, then the procedure report has been included in a sealed envelope for you to review at your convenience later.  YOU SHOULD EXPECT: Some feelings of bloating in the abdomen. Passage of more gas than usual.  Walking can help get rid of the air that was put into your GI tract during the procedure and reduce the bloating. If you had a lower endoscopy (such as a colonoscopy or flexible sigmoidoscopy) you may notice spotting of blood in your stool or on the toilet paper. If you underwent a bowel prep for your procedure, then you may not have a normal bowel movement for a few days.  DIET: Your first meal following the procedure should be a light meal and then it is ok to progress to your normal diet.  A half-sandwich or bowl of soup is an example of a good first meal.  Heavy or fried foods are harder to digest and may make you feel nauseous or bloated.  Likewise meals heavy in dairy and vegetables can cause extra gas to form and this can also increase the bloating.  Drink plenty of fluids but you should avoid alcoholic beverages for 24 hours.  ACTIVITY: Your care partner should take you home directly after the procedure.  You should plan to take it easy, moving slowly for the rest of the day.  You can resume normal activity the day after the procedure however you should NOT DRIVE or use heavy machinery for 24 hours (because of the sedation medicines used during the test).    SYMPTOMS TO REPORT IMMEDIATELY: A gastroenterologist can be reached at any hour.  During normal business hours, 8:30 AM to 5:00 PM Monday through Friday,  call [phone].  After hours and on weekends, please call the GI answering service at [phone] who will take a message and have the physician on call contact you.   Following lower endoscopy (colonoscopy or flexible sigmoidoscopy):  Excessive amounts of blood in the stool  Significant tenderness or worsening of abdominal pains  Swelling of the abdomen that is new, acute  Fever of 100F or higher    FOLLOW UP: If any biopsies were taken you will be contacted by phone or by letter within the next 1-3 weeks.  Call your gastroenterologist if you have not heard about the biopsies in 3 weeks.  Our staff will call the home number listed on your records the next business day following your procedure to check on you and address any questions or concerns that you may have at that time regarding the information given to you following your procedure. This is a courtesy call and so if there is no answer at the home number and we have not heard from you through the emergency physician on call, we will assume that you have returned to your regular daily activities without incident.  SIGNATURES/CONFIDENTIALITY: You and/or your care partner have signed paperwork which will be entered into your electronic medical record.  These signatures attest to the fact that that the information above on your After Visit Summary has been reviewed and is understood.  Full responsibility of the confidentiality   of this discharge information lies with you and/or your care-partner.     

## 2013-03-10 NOTE — Op Note (Signed)
[facility] [address] [place], [id number]   COLONOSCOPY PROCEDURE REPORT  PATIENT: Jack Farley, Jack W.  MR#: [id number] BIRTHDATE: [birth year] , 47  yrs. old GENDER: Male ENDOSCOPIST: Beverley FiedlerJay M Jaxon Mynhier, MD REFERRED QM:VHQIONGBY:Michael Esther HardyE Norins, M.D. PROCEDURE DATE:  03/10/2013 PROCEDURE:   Colonoscopy, screening First Screening Colonoscopy - Avg.  risk and is 50 yrs.  old or older - No.  Prior Negative Screening - Now for repeat screening. N/A  History of Adenoma - Now for follow-up colonoscopy & has been > or = to 3 yrs.  N/A  Polyps Removed Today? No.  Recommend repeat exam, <10 yrs? No. ASA CLASS:   Class II INDICATIONS:average risk screening and scant rectal bleeding, fecal soilage. MEDICATIONS: MAC sedation, administered by CRNA and propofol (Diprivan) 500mg  IV  DESCRIPTION OF PROCEDURE:   After the risks benefits and alternatives of the procedure were thoroughly explained, informed consent was obtained.  A digital rectal exam revealed no rectal mass.   The LB [id number]  endoscope was introduced through the anus and advanced to the cecum, which was identified by both the appendix and ileocecal valve. No adverse events experienced. The quality of the prep was good, using MoviPrep  The instrument was then slowly withdrawn as the colon was fully examined.   COLON FINDINGS: Single diverticulum was found at the splenic flexure.  The opening was small.   The colon was otherwise normal. There was no inflammation, polyps or cancers seen.  Retroflexed views revealed no abnormalities. The time to cecum=4 minutes 52 seconds.  Withdrawal time=10 minutes 28 seconds.  The scope was withdrawn and the procedure completed.  COMPLICATIONS: There were no complications.  ENDOSCOPIC IMPRESSION: 1.   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   The colon was otherwise normal  RECOMMENDATIONS: 1.  High fiber diet 2.  You should continue to follow colorectal cancer  screening guidelines for "routine risk" patients with a repeat colonoscopy in 10 years.  There is no need for FOBT (stool) testing for at least 5 years.   eSigned:  Beverley FiedlerJay M Mulki Roesler, MD 03/10/2013 4:46 PM   cc: The Patient and Jacques NavyMichael E Norins, MD

## 2013-03-13 ENCOUNTER — Telehealth: Payer: Self-pay | Admitting: *Deleted

## 2013-03-13 NOTE — Telephone Encounter (Signed)
  Follow up Call-  Call back number 03/10/2013  Post procedure Call Back phone  # [phone]  Permission to leave phone message Yes     Patient questions:  Do you have a fever, pain , or abdominal swelling? no Pain Score  0 *  Have you tolerated food without any problems? yes  Have you been able to return to your normal activities? yes  Do you have any questions about your discharge instructions: Diet   no Medications  no Follow up visit  no  Do you have questions or concerns about your Care? no  Actions: * If pain score is 4 or above: No action needed, pain <4.

## 2013-11-24 ENCOUNTER — Other Ambulatory Visit: Payer: Self-pay

## 2018-06-20 ENCOUNTER — Telehealth: Payer: Self-pay | Admitting: Orthopaedic Surgery

## 2018-06-20 NOTE — Telephone Encounter (Signed)
Pt called for an apt with Dr Ophelia Charter.  I called pt left a VM advised pt to call back before 5 or first thing in the AM

## 2018-06-21 NOTE — Telephone Encounter (Signed)
Pt has appt for 06/22/2018 AM.

## 2018-06-22 ENCOUNTER — Ambulatory Visit (INDEPENDENT_AMBULATORY_CARE_PROVIDER_SITE_OTHER): Payer: 59 | Admitting: Orthopaedic Surgery

## 2018-06-22 ENCOUNTER — Encounter: Payer: Self-pay | Admitting: Orthopaedic Surgery

## 2018-06-22 ENCOUNTER — Other Ambulatory Visit: Payer: Self-pay

## 2018-06-22 ENCOUNTER — Ambulatory Visit (INDEPENDENT_AMBULATORY_CARE_PROVIDER_SITE_OTHER): Payer: 59

## 2018-06-22 VITALS — Ht 73.0 in | Wt 220.0 lb

## 2018-06-22 DIAGNOSIS — M25521 Pain in right elbow: Secondary | ICD-10-CM

## 2018-06-22 DIAGNOSIS — M7521 Bicipital tendinitis, right shoulder: Secondary | ICD-10-CM

## 2018-06-22 NOTE — Progress Notes (Addendum)
Office Visit Note   Patient: Jack Farley           Date of Birth: 1965/11/30           MRN: [id number] Visit Date: 06/22/2018              Requested by: Martha Clan, MD [address] [place], [place] [address] PCP: Martha Clan, MD   Assessment & Plan: Visit Diagnoses:  1. Pain in right elbow   2. Biceps tendinitis of right upper extremity     Plan: Patient has some tenderness of the distal biceps tendon probably related to the hand weights he would been using.  We discussed activity modification anti-inflammatory some intermittent use ice.  He does not have any palpable defects in the tendon and MRI imaging can be deferred at this time.  He will let me know if he has persistent symptoms.  Pathophysiology discussed.  Follow-Up Instructions: Return if symptoms worsen or fail to improve.   Orders:  Orders Placed This Encounter  Procedures  . XR Elbow 2 Views Right   No orders of the defined types were placed in this encounter.     Procedures: No procedures performed   Clinical Data: No additional findings.   Subjective: Chief Complaint  Patient presents with  . Right Elbow - Pain    HPI 53 year old male with right antecubital pain present for about a month.  He been working out some at [employer] using some hand weights and noticed some anterior pain on the right elbow.  He denies numbness or tingling in his fingers no associated neck pain.  He has been along time tennis player but has not been playing during the corona virus lockdown.  No past history of injury to the elbow.  He is noted pain with pulling.  Previous ganglion excision in his foot several years ago with no recurrence.  No other rheumatologic conditions.  Review of Systems 14 point systems update noncontributory.  Previous knee arthroscopy.  Ganglion excision in his right foot without recurrence.   Objective: Vital Signs: Ht 6\' 1"  (1.854 m)   Wt 220 lb (99.8 kg)   BMI 29.03 kg/m   Physical  Exam Constitutional:      Appearance: He is well-developed.  HENT:     Head: Normocephalic and atraumatic.  Eyes:     Pupils: Pupils are equal, round, and reactive to light.  Neck:     Thyroid: No thyromegaly.     Trachea: No tracheal deviation.  Cardiovascular:     Rate and Rhythm: Normal rate.  Pulmonary:     Effort: Pulmonary effort is normal.     Breath sounds: No wheezing.  Abdominal:     General: Bowel sounds are normal.     Palpations: Abdomen is soft.  Skin:    General: Skin is warm and dry.     Capillary Refill: Capillary refill takes less than 2 seconds.  Neurological:     Mental Status: He is alert and oriented to person, place, and time.  Psychiatric:        Behavior: Behavior normal.        Thought Content: Thought content normal.        Judgment: Judgment normal.     Ortho Exam patient has tenderness over the distal biceps tendon in the antecubital fossa.  There is no palpable defect no swelling in the antecubital fossa noted.  Increased pain with some resisted strength testing.  Supination pronation is normal  he has some mild tenderness over the lateral epicondyles and minimally over the medial epicondyle.  Ulnar nerve at the elbow is normal median nerve at the wrist is normal in the wrist flexion extension weakness no finger extension weakness.  Long head of the biceps of the shoulder is normal negative impingement the shoulder good cervical range of motion no brachial plexus tenderness.  Specialty Comments:  No specialty comments available.  Imaging: No results found.   PMFS History: Patient Active Problem List   Diagnosis Date Noted  . Biceps tendinitis of right upper extremity 06/23/2018  . Health maintenance examination 09/14/2010  . HYPERLIPIDEMIA 03/12/2007  . HERPES SIMPLEX INFECTION, RECURRENT 03/10/2007  . ALLERGIC RHINITIS 03/10/2007  . ARTHROSCOPY, KNEE, HX OF 03/10/2007   Past Medical History:  Diagnosis Date  . Allergic rhinitis, cause  unspecified   . Herpes simplex without mention of complication   . HLD (hyperlipidemia)     Family History  Problem Relation Age of Onset  . Mental illness Mother   . Hyperlipidemia Father   . Cancer Neg Hx   . Diabetes Neg Hx   . Heart disease Neg Hx   . Heart attack Paternal Grandfather     Past Surgical History:  Procedure Laterality Date  . GANGLION CYST EXCISION Right    foot  . KNEE ARTHROSCOPY Left    age 53   Social History   Occupational History  . Occupation: Advertising account plannerfinancial advisor and investments  Tobacco Use  . Smoking status: Never Smoker  . Smokeless tobacco: Never Used  Substance and Sexual Activity  . Alcohol use: Yes    Comment: 1-2 per day  . Drug use: No  . Sexual activity: Yes    Partners: Female

## 2018-06-23 DIAGNOSIS — M7521 Bicipital tendinitis, right shoulder: Secondary | ICD-10-CM | POA: Insufficient documentation

## 2019-04-27 ENCOUNTER — Ambulatory Visit: Payer: 59 | Attending: Internal Medicine

## 2019-04-27 DIAGNOSIS — Z23 Encounter for immunization: Secondary | ICD-10-CM

## 2019-04-27 NOTE — Progress Notes (Signed)
   Covid-19 Vaccination Clinic  Name:  Jack Farley    MRN: [id number] DOB: November 07, 1965  04/27/2019  Mr. Swiney was observed post Covid-19 immunization for 15 minutes without incident. He was provided with Vaccine Information Sheet and instruction to access the V-Safe system.   Mr. Ginyard was instructed to call 911 with any severe reactions post vaccine: Marland Kitchen Difficulty breathing  . Swelling of face and throat  . A fast heartbeat  . A bad rash all over body  . Dizziness and weakness   Immunizations Administered    Name Date Dose VIS Date Route   Pfizer COVID-19 Vaccine 04/27/2019  9:16 AM 0.3 mL 01/20/2019 Intramuscular   Manufacturer: [employer], [employer]   Lot: [id number]   NDC: [address]-7

## 2019-05-22 ENCOUNTER — Ambulatory Visit: Payer: 59 | Attending: Internal Medicine

## 2019-05-22 DIAGNOSIS — Z23 Encounter for immunization: Secondary | ICD-10-CM

## 2019-05-22 NOTE — Progress Notes (Signed)
   Covid-19 Vaccination Clinic  Name:  Jack Farley    MRN: [id number] DOB: 1965/05/18  05/22/2019  [name] was observed post Covid-19 immunization for 15 minutes without incident. He was provided with Vaccine Information Sheet and instruction to access the V-Safe system.   [name] was instructed to call 911 with any severe reactions post vaccine: Marland Kitchen Difficulty breathing  . Swelling of face and throat  . A fast heartbeat  . A bad rash all over body  . Dizziness and weakness   Immunizations Administered    Name Date Dose VIS Date Route   Pfizer COVID-19 Vaccine 05/22/2019  9:11 AM 0.3 mL 01/20/2019 Intramuscular   Manufacturer: [employer], [employer]   Lot: [id number]   NDC: [address]-8

## 2020-06-21 ENCOUNTER — Other Ambulatory Visit: Payer: Self-pay | Admitting: Internal Medicine

## 2020-06-21 DIAGNOSIS — E785 Hyperlipidemia, unspecified: Secondary | ICD-10-CM

## 2020-07-22 ENCOUNTER — Ambulatory Visit
Admission: RE | Admit: 2020-07-22 | Discharge: 2020-07-22 | Disposition: A | Discharge status: Home / Self Care | Payer: No Typology Code available for payment source | Source: Ambulatory Visit | Attending: Internal Medicine | Admitting: Internal Medicine

## 2020-07-22 DIAGNOSIS — E785 Hyperlipidemia, unspecified: Secondary | ICD-10-CM

## 2022-04-22 ENCOUNTER — Ambulatory Visit: Payer: Managed Care, Other (non HMO) | Admitting: Orthopaedic Surgery

## 2022-04-22 ENCOUNTER — Ambulatory Visit: Payer: Self-pay

## 2022-04-22 VITALS — BP 148/66 | HR 61

## 2022-04-22 DIAGNOSIS — G8929 Other chronic pain: Secondary | ICD-10-CM

## 2022-04-22 DIAGNOSIS — M65962 Unspecified synovitis and tenosynovitis, left lower leg: Secondary | ICD-10-CM | POA: Insufficient documentation

## 2022-04-22 DIAGNOSIS — M659 Synovitis and tenosynovitis, unspecified: Secondary | ICD-10-CM

## 2022-04-22 DIAGNOSIS — M25562 Pain in left knee: Secondary | ICD-10-CM

## 2022-04-22 MED ORDER — LIDOCAINE HCL 1 % IJ SOLN
0.5000 mL | INTRAMUSCULAR | Status: AC | PRN
  Administered 2022-04-22: .5 mL

## 2022-04-22 MED ORDER — BUPIVACAINE HCL 0.5 % IJ SOLN
3.0000 mL | INTRAMUSCULAR | Status: AC | PRN
  Administered 2022-04-22: 3 mL via INTRA_ARTICULAR

## 2022-04-22 MED ORDER — METHYLPREDNISOLONE ACETATE 40 MG/ML IJ SUSP
40.0000 mg | INTRAMUSCULAR | Status: AC | PRN
  Administered 2022-04-22: 40 mg via INTRA_ARTICULAR

## 2022-04-22 NOTE — Progress Notes (Signed)
Office Visit Note   Patient: Jack Farley           Date of Birth: [birth year]           MRN: [id number] Visit Date: 04/22/2022              Requested by: Ginger Organ., MD [address] [place],  [place] [address] PCP: Ginger Organ., MD   Assessment & Plan: Visit Diagnoses:  1. Chronic pain of left knee     Plan: Patient tolerated the injection well.  He is going skiing in few weeks he will return if he has ongoing problems.  Follow-Up Instructions: No follow-ups on file.   Orders:  Orders Placed This Encounter  Procedures   Large Joint Inj: L knee   XR Knee 1-2 Views Left   No orders of the defined types were placed in this encounter.     Procedures: Large Joint Inj: L knee on 04/22/2022 9:19 AM Indications: joint swelling and pain Details: 22 G 1.5 in needle, anterolateral approach  Arthrogram: No  Medications: 0.5 mL lidocaine 1 %; 3 mL bupivacaine 0.5 %; 40 mg methylPREDNISolone acetate 40 MG/ML Outcome: tolerated well, no immediate complications Procedure, treatment alternatives, risks and benefits explained, specific risks discussed. Consent was given by the patient. Immediately prior to procedure a time out was called to verify the correct patient, procedure, equipment, support staff and site/side marked as required. Patient was prepped and draped in the usual sterile fashion.       Clinical Data: No additional findings.   Subjective: Chief Complaint  Patient presents with   Left Knee - Pain    HPI 57 year old male with left knee pain and swelling.  He is playing tennis weekly when he turns and twists he has had some increased discomfort pain in the back of his knee.  No true locking.  Past history of meniscectomy many years ago he is unsure if it is medial or lateral.  He has not had any x-rays in many years of his knee.  Review of Systems patient is an active and healthy has had seasonal allergies.  All other systems noncontributory  HPI.   Objective: Vital Signs: BP (!) 148/66   Pulse 61   Physical Exam Constitutional:      Appearance: He is well-developed.  HENT:     Head: Normocephalic and atraumatic.     Right Ear: External ear normal.     Left Ear: External ear normal.  Eyes:     Pupils: Pupils are equal, round, and reactive to light.  Neck:     Thyroid: No thyromegaly.     Trachea: No tracheal deviation.  Cardiovascular:     Rate and Rhythm: Normal rate.  Pulmonary:     Effort: Pulmonary effort is normal.     Breath sounds: No wheezing.  Abdominal:     General: Bowel sounds are normal.     Palpations: Abdomen is soft.  Musculoskeletal:     Cervical back: Neck supple.  Skin:    General: Skin is warm and dry.     Capillary Refill: Capillary refill takes less than 2 seconds.  Neurological:     Mental Status: He is alert and oriented to person, place, and time.  Psychiatric:        Behavior: Behavior normal.        Thought Content: Thought content normal.        Judgment: Judgment normal.  Ortho Exam patient has a 2+ knee effusion left knee some medial and lateral joint line tenderness trace so small Baker's cyst cysts posteriorly.  Hamstrings are normal ACL PCL exam is normal.  Specialty Comments:  No specialty comments available.  Imaging: No results found.   PMFS History: Patient Active Problem List   Diagnosis Date Noted   Biceps tendinitis of right upper extremity 06/23/2018   Health maintenance examination 09/14/2010   HYPERLIPIDEMIA 03/12/2007   HERPES SIMPLEX INFECTION, RECURRENT 03/10/2007   ALLERGIC RHINITIS 03/10/2007   ARTHROSCOPY, KNEE, HX OF 03/10/2007   Past Medical History:  Diagnosis Date   Allergic rhinitis, cause unspecified    Herpes simplex without mention of complication    HLD (hyperlipidemia)     Family History  Problem Relation Age of Onset   Mental illness Mother    Hyperlipidemia Father    Cancer Neg Hx    Diabetes Neg Hx    Heart disease Neg  Hx    Heart attack Paternal Grandfather     Past Surgical History:  Procedure Laterality Date   GANGLION CYST EXCISION Right    foot   KNEE ARTHROSCOPY Left    age 16   Social History   Occupational History   Occupation: Music therapist and investments  Tobacco Use   Smoking status: Never   Smokeless tobacco: Never  Substance and Sexual Activity   Alcohol use: Yes    Comment: 1-2 per day   Drug use: No   Sexual activity: Yes    Partners: Female

## 2022-08-01 IMAGING — CT CT CARDIAC CORONARY ARTERY CALCIUM SCORE
3 series · 14 of 20 positions shown, 16 images · non-contrast
Comparison: No priors.

CLINICAL DATA: 55-year-old Caucasian male with elevated cholesterol
levels and family history of coronary artery disease.

EXAM:
CT CARDIAC CORONARY ARTERY CALCIUM SCORE
TECHNIQUE: Non-contrast imaging through the heart was performed using
prospective ECG gating. Image post processing was performed on an
independent workstation, allowing for quantitative analysis of the
heart and coronary arteries. Note that this exam targets the heart
and the chest was not imaged in its entirety.

[Series 2: calcium scoring 2.00 qr36 bestdiast 68% hrt calciu · axial · 0.43mm/px · z∈[+1663,+1735]mm · 4 of 60 slices shown]
[im 12/60  vessel]
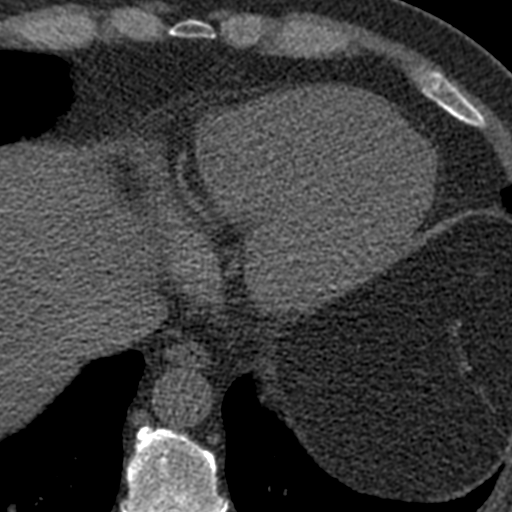
[im 24/60  vessel]
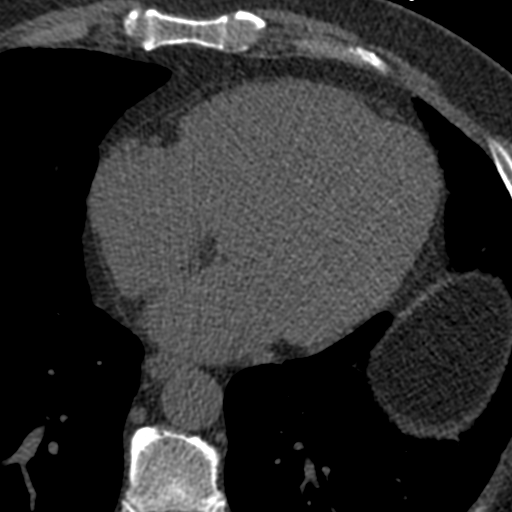
[im 36/60  vessel]
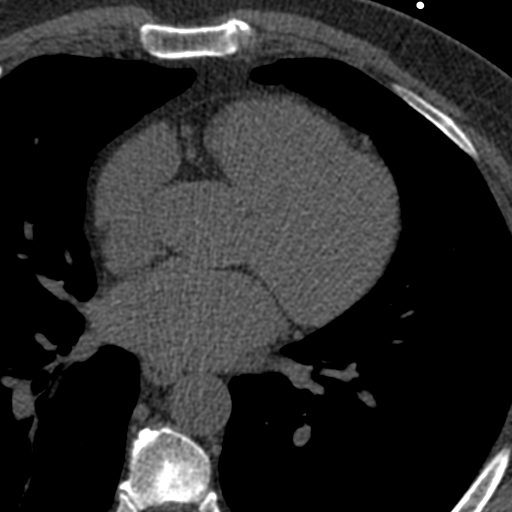
[im 48/60  vessel]
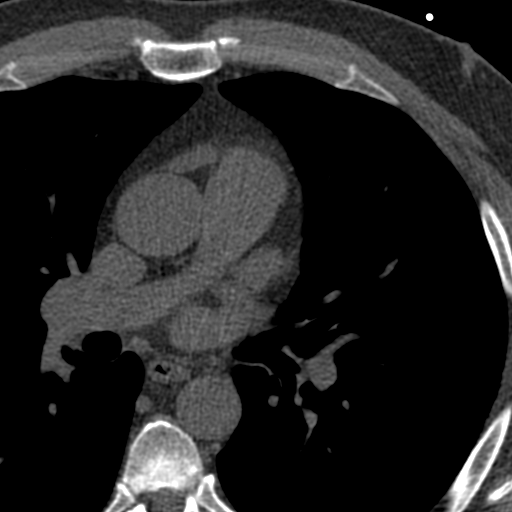

[Series 3: calcium scoring 2.00 br40 bestdiast 68% axial · axial · 0.69mm/px · z∈[+1659,+1739]mm · 5 of 60 slices shown, 7 images]
[im 10/60  vessel]
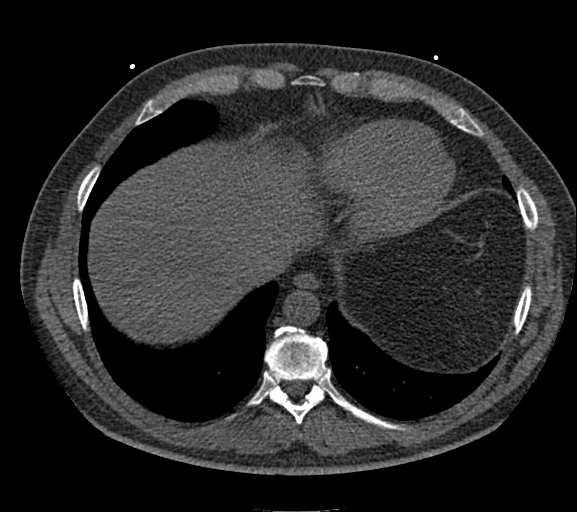
[im 10/60  lung]
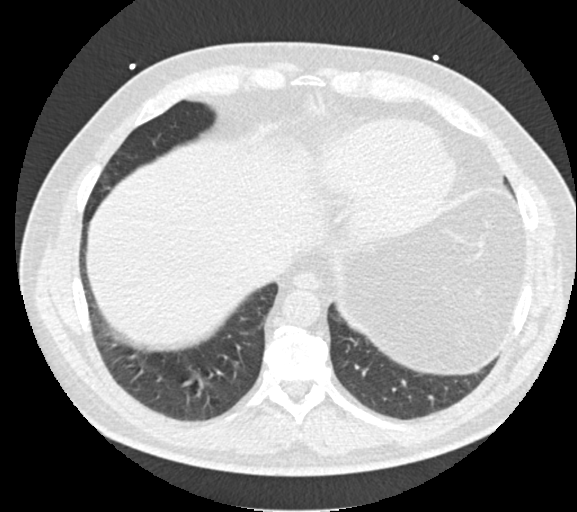
[im 20/60  vessel]
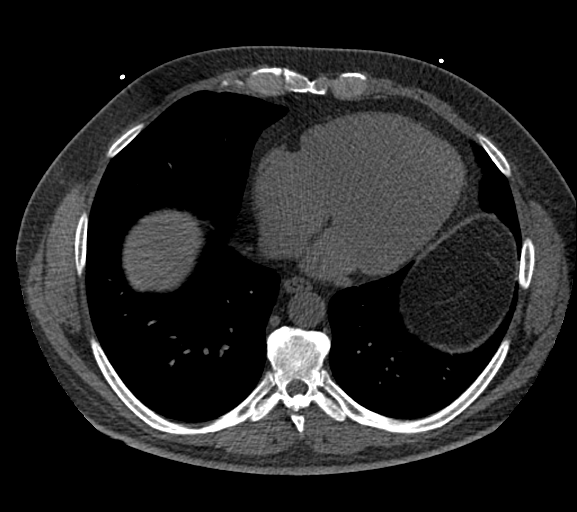
[im 30/60  vessel]
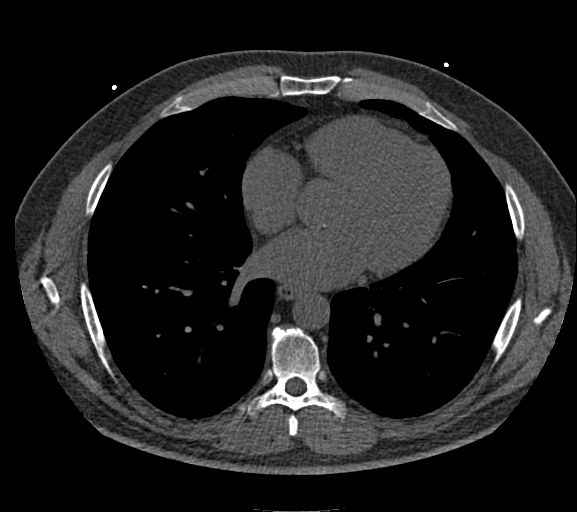
[im 40/60  vessel]
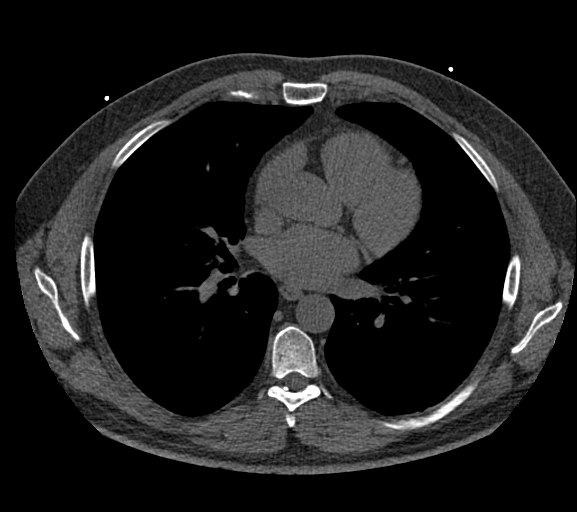
[im 50/60  vessel]
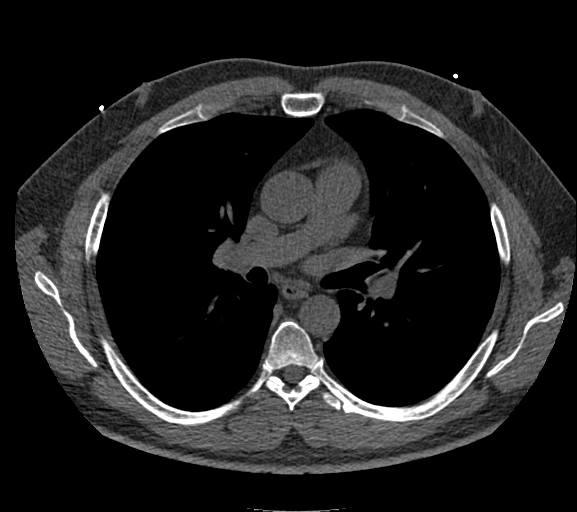
[im 50/60  lung]
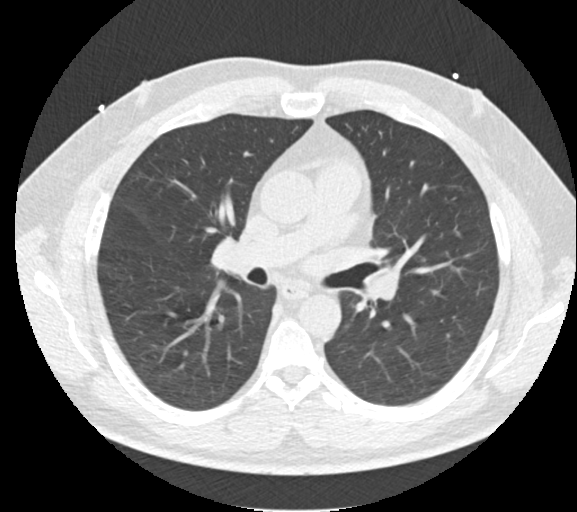

[Series 9: calcium scoring 2.00 br60 bestdiast 68% lungs · axial · 0.69mm/px · z∈[+1659,+1739]mm · 5 of 60 slices shown]
[im 10/60  vessel]
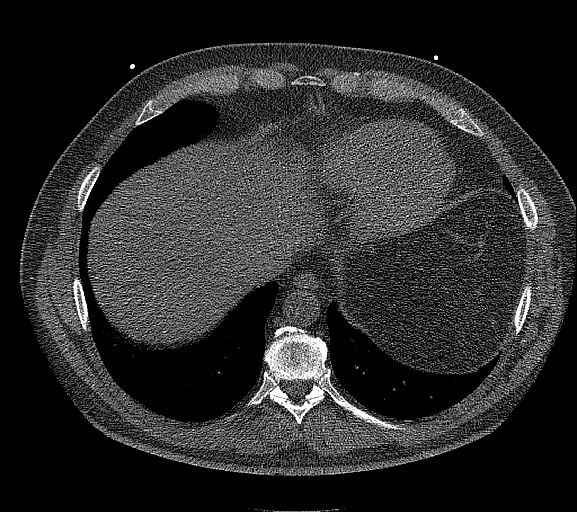
[im 20/60  vessel]
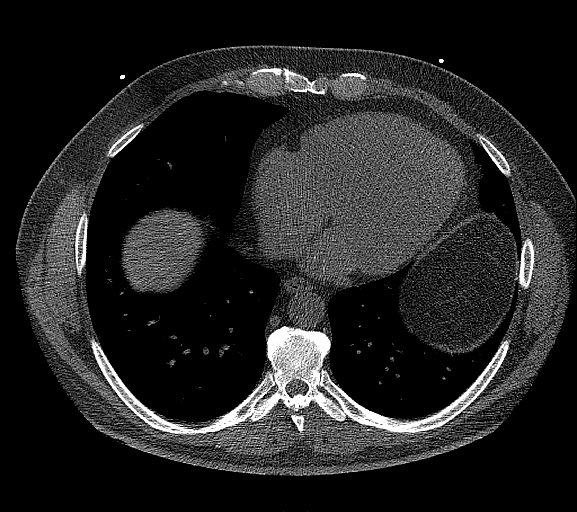
[im 30/60  vessel]
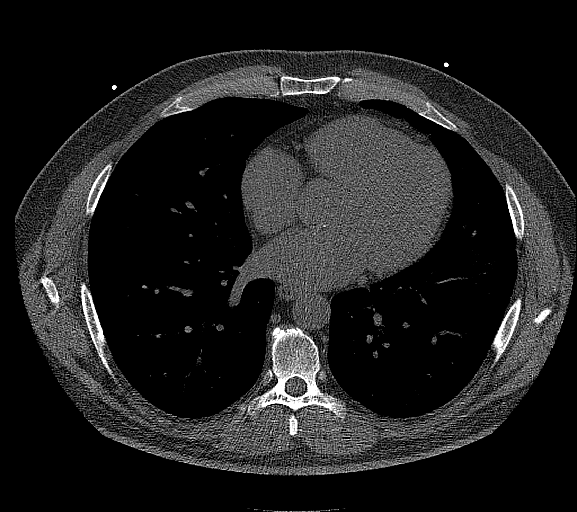
[im 40/60  vessel]
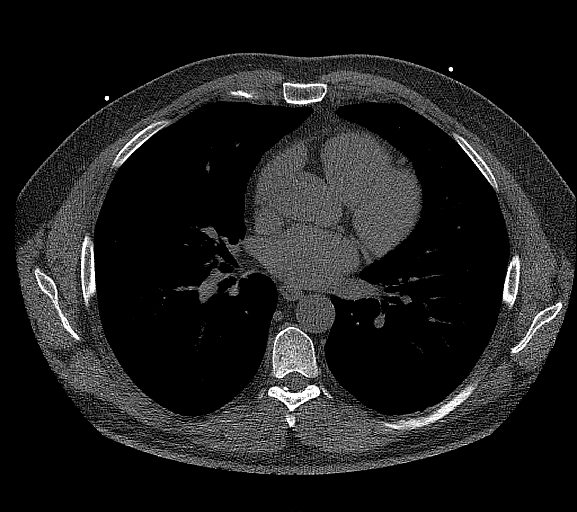
[im 50/60  vessel]
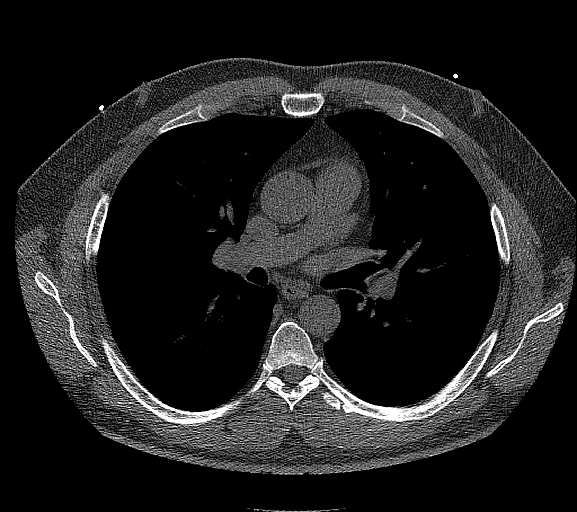

[14 of 20 positions shown; findings below may reference images not displayed]

FINDINGS: CORONARY CALCIUM SCORES:

Left Main: 0

LAD: 0

LCx: 0

RCA: 2

Total Agatston Score: 2

[HOSPITAL] percentile: 45th

AORTA MEASUREMENTS:

Ascending Aorta: 36 mm

Descending Aorta: 27 mm

EXTRACARDIAC FINDINGS:

Atherosclerotic calcifications in the thoracic aorta. Within the
visualized portions of the thorax there are no suspicious appearing
pulmonary nodules or masses, there is no acute consolidative
airspace disease, no pleural effusions, no pneumothorax and no
lymphadenopathy. Visualized portions of the upper abdomen are
unremarkable. There are no aggressive appearing lytic or blastic
lesions noted in the visualized portions of the skeleton.
IMPRESSION: 1. Patient's total coronary artery calcium score is 2 which is 45th
percentile for patient's of matched age, gender and race/ethnicity.
Please note that although the presence of coronary artery calcium
documents the presence of coronary artery disease, the severity of
this disease and any potential stenosis cannot be assessed on this
noncontrast CT examination. Assessment for potential risk factor
modification, dietary therapy or pharmacologic therapy may be
warranted, if clinically indicated.
2.  Aortic Atherosclerosis ([id number]).

## 2022-08-10 ENCOUNTER — Encounter: Payer: Self-pay | Admitting: Orthopaedic Surgery

## 2022-08-10 ENCOUNTER — Ambulatory Visit (INDEPENDENT_AMBULATORY_CARE_PROVIDER_SITE_OTHER): Payer: Managed Care, Other (non HMO) | Admitting: Orthopaedic Surgery

## 2022-08-10 ENCOUNTER — Other Ambulatory Visit (INDEPENDENT_AMBULATORY_CARE_PROVIDER_SITE_OTHER): Payer: Managed Care, Other (non HMO)

## 2022-08-10 VITALS — BP 137/87 | HR 64 | Ht 73.0 in | Wt 237.0 lb

## 2022-08-10 DIAGNOSIS — M79672 Pain in left foot: Secondary | ICD-10-CM

## 2022-08-11 NOTE — Progress Notes (Signed)
Office Visit Note   Patient: Jack Farley           Date of Birth: 1965/10/27           MRN: [id number] Visit Date: 08/10/2022              Requested by: Cleatis Polka., MD [address] [place],  [place] [address] PCP: Cleatis Polka., MD   Assessment & Plan: Visit Diagnoses:  1. Pain in left foot     Plan: Patient symptoms most consistent with left forefoot strain.  No evidence of stress fracture.  He will continue with some anti-inflammatories elevation.  Longitudinal arch support applied.  He needs to get some shoes that have more arch buildup than what has been wearing to help unload the forefoot.  He can follow-up if he has persistent symptoms.  We discussed the metatarsalgia.  Follow-Up Instructions: No follow-ups on file.   Orders:  Orders Placed This Encounter  Procedures   XR Foot Complete Left   No orders of the defined types were placed in this encounter.     Procedures: No procedures performed   Clinical Data: No additional findings.   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  He has tenderness over the pad of the forefoot.  No activity changes that would suggest a stress fracture.  States he feels like some pressure on the front part of his foot.  He relates he did have problems with plantar fasciitis many years ago nothing recently.  Review of Systems 14 point review of systems updated noncontributory.  Previous knee arthroscopy.   Objective: Vital Signs: BP 137/87   Pulse 64   Ht 6\' 1"  (1.854 m)   Wt 237 lb (107.5 kg)   BMI 31.27 kg/m   Physical Exam Constitutional:      Appearance: He is well-developed.  HENT:     Head: Normocephalic and atraumatic.     Right Ear: External ear normal.     Left Ear: External  ear normal.  Eyes:     Pupils: Pupils are equal, round, and reactive to light.  Neck:     Thyroid: No thyromegaly.     Trachea: No tracheal deviation.  Cardiovascular:     Rate and Rhythm: Normal rate.  Pulmonary:     Effort: Pulmonary effort is normal.     Breath sounds: No wheezing.  Abdominal:     General: Bowel sounds are normal.     Palpations: Abdomen is soft.  Musculoskeletal:     Cervical back: Neck supple.  Skin:    General: Skin is warm and dry.     Capillary Refill: Capillary refill takes less than 2 seconds.  Neurological:     Mental Status: He is alert and oriented to person, place, and time.  Psychiatric:        Behavior: Behavior normal.        Thought Content: Thought content normal.        Judgment: Judgment normal.     Ortho Exam no tenderness over the Achilles tendon insertion on the calcaneus or plantar fascia.  Subtalar motion ankle range of motion is normal.  There is forefoot swelling tenderness more over her second and third metatarsal no tenderness over the  sesamoids.  Negative forefoot compression test.  No plantar lesions.  He has toe flexion and extension.  Specialty Comments:  No specialty comments available.  Imaging: No results found.   PMFS History: Patient Active Problem List   Diagnosis Date Noted   Synovitis of left knee 04/22/2022   Biceps tendinitis of right upper extremity 06/23/2018   Health maintenance examination 09/14/2010   HYPERLIPIDEMIA 03/12/2007   HERPES SIMPLEX INFECTION, RECURRENT 03/10/2007   ALLERGIC RHINITIS 03/10/2007   ARTHROSCOPY, KNEE, HX OF 03/10/2007   Past Medical History:  Diagnosis Date   Allergic rhinitis, cause unspecified    Herpes simplex without mention of complication    HLD (hyperlipidemia)     Family History  Problem Relation Age of Onset   Mental illness Mother    Hyperlipidemia Father    Cancer Neg Hx    Diabetes Neg Hx    Heart disease Neg Hx    Heart attack Paternal Grandfather      Past Surgical History:  Procedure Laterality Date   GANGLION CYST EXCISION Right    foot   KNEE ARTHROSCOPY Left    age 8   Social History   Occupational History   Occupation: Firefighter and investments  Tobacco Use   Smoking status: Never   Smokeless tobacco: Never  Substance and Sexual Activity   Alcohol use: Yes    Comment: 1-2 per day   Drug use: No   Sexual activity: Yes    Partners: Female

## 2023-11-03 ENCOUNTER — Encounter: Payer: Self-pay | Admitting: Internal Medicine
